# Patient Record
Sex: Female | Born: 1945 | Race: White | Hispanic: No | Marital: Married | Smoking: Former smoker
Health system: Southern US, Community
[De-identification: ages and names within clinical notes are randomized; demographics above are authoritative.]

## PROBLEM LIST (undated history)

## (undated) DIAGNOSIS — I10 Essential (primary) hypertension: Secondary | ICD-10-CM

## (undated) DIAGNOSIS — J449 Chronic obstructive pulmonary disease, unspecified: Secondary | ICD-10-CM

---

## 2003-08-31 DIAGNOSIS — M19049 Primary osteoarthritis, unspecified hand: Secondary | ICD-10-CM | POA: Insufficient documentation

## 2009-11-19 DIAGNOSIS — R918 Other nonspecific abnormal finding of lung field: Secondary | ICD-10-CM | POA: Insufficient documentation

## 2010-02-04 DIAGNOSIS — M949 Disorder of cartilage, unspecified: Secondary | ICD-10-CM | POA: Insufficient documentation

## 2010-02-04 DIAGNOSIS — M899 Disorder of bone, unspecified: Secondary | ICD-10-CM | POA: Insufficient documentation

## 2011-12-08 DIAGNOSIS — J309 Allergic rhinitis, unspecified: Secondary | ICD-10-CM | POA: Insufficient documentation

## 2012-07-20 DIAGNOSIS — I2510 Atherosclerotic heart disease of native coronary artery without angina pectoris: Secondary | ICD-10-CM | POA: Insufficient documentation

## 2012-10-24 ENCOUNTER — Inpatient Hospital Stay (HOSPITAL_COMMUNITY)
Admission: EM | Admit: 2012-10-24 | Discharge: 2012-10-28 | DRG: 184 | Disposition: A | Discharge status: Home / Self Care | Payer: Medicare Other | Attending: General Surgery | Admitting: General Surgery

## 2012-10-24 ENCOUNTER — Emergency Department (HOSPITAL_COMMUNITY): Payer: Medicare Other

## 2012-10-24 ENCOUNTER — Encounter (HOSPITAL_COMMUNITY): Payer: Self-pay | Admitting: Emergency Medicine

## 2012-10-24 DIAGNOSIS — Z87891 Personal history of nicotine dependence: Secondary | ICD-10-CM

## 2012-10-24 DIAGNOSIS — E041 Nontoxic single thyroid nodule: Secondary | ICD-10-CM | POA: Diagnosis present

## 2012-10-24 DIAGNOSIS — S42409A Unspecified fracture of lower end of unspecified humerus, initial encounter for closed fracture: Secondary | ICD-10-CM | POA: Diagnosis present

## 2012-10-24 DIAGNOSIS — IMO0002 Reserved for concepts with insufficient information to code with codable children: Secondary | ICD-10-CM | POA: Diagnosis present

## 2012-10-24 DIAGNOSIS — S2220XA Unspecified fracture of sternum, initial encounter for closed fracture: Secondary | ICD-10-CM

## 2012-10-24 DIAGNOSIS — Z9981 Dependence on supplemental oxygen: Secondary | ICD-10-CM

## 2012-10-24 DIAGNOSIS — I2510 Atherosclerotic heart disease of native coronary artery without angina pectoris: Secondary | ICD-10-CM | POA: Diagnosis present

## 2012-10-24 DIAGNOSIS — I10 Essential (primary) hypertension: Secondary | ICD-10-CM | POA: Diagnosis present

## 2012-10-24 DIAGNOSIS — J449 Chronic obstructive pulmonary disease, unspecified: Secondary | ICD-10-CM | POA: Diagnosis present

## 2012-10-24 DIAGNOSIS — S5010XA Contusion of unspecified forearm, initial encounter: Secondary | ICD-10-CM | POA: Diagnosis present

## 2012-10-24 DIAGNOSIS — M25429 Effusion, unspecified elbow: Secondary | ICD-10-CM | POA: Diagnosis present

## 2012-10-24 DIAGNOSIS — S82899A Other fracture of unspecified lower leg, initial encounter for closed fracture: Secondary | ICD-10-CM

## 2012-10-24 DIAGNOSIS — S42401A Unspecified fracture of lower end of right humerus, initial encounter for closed fracture: Secondary | ICD-10-CM

## 2012-10-24 DIAGNOSIS — M19049 Primary osteoarthritis, unspecified hand: Secondary | ICD-10-CM | POA: Diagnosis present

## 2012-10-24 DIAGNOSIS — T07XXXA Unspecified multiple injuries, initial encounter: Secondary | ICD-10-CM | POA: Diagnosis present

## 2012-10-24 DIAGNOSIS — S82891A Other fracture of right lower leg, initial encounter for closed fracture: Secondary | ICD-10-CM

## 2012-10-24 DIAGNOSIS — J4489 Other specified chronic obstructive pulmonary disease: Secondary | ICD-10-CM | POA: Diagnosis present

## 2012-10-24 DIAGNOSIS — S52043A Displaced fracture of coronoid process of unspecified ulna, initial encounter for closed fracture: Secondary | ICD-10-CM | POA: Diagnosis present

## 2012-10-24 HISTORY — DX: Essential (primary) hypertension: I10

## 2012-10-24 HISTORY — DX: Chronic obstructive pulmonary disease, unspecified: J44.9

## 2012-10-24 LAB — COMPREHENSIVE METABOLIC PANEL
ALT: 23 U/L (ref 0–35)
BUN: 14 mg/dL (ref 6–23)
Calcium: 9 mg/dL (ref 8.4–10.5)
Creatinine, Ser: 0.58 mg/dL (ref 0.50–1.10)
GFR calc Af Amer: >90 mL/min (ref >90)
Glucose, Bld: 101 mg/dL — ABNORMAL HIGH (ref 70–99)
Sodium: 142 mEq/L (ref 135–145)
Total Protein: 6.6 g/dL (ref 6.0–8.3)

## 2012-10-24 LAB — CBC WITH DIFFERENTIAL/PLATELET
Basophils Absolute: 0 10*3/uL (ref 0.0–0.1)
Eosinophils Absolute: 0 10*3/uL (ref 0.0–0.7)
Eosinophils Relative: 0 % (ref 0–5)
Hemoglobin: 12.5 g/dL (ref 12.0–15.0)
Lymphs Abs: 1.3 10*3/uL (ref 0.7–4.0)
MCH: 30.9 pg (ref 26.0–34.0)
MCV: 93.3 fL (ref 78.0–100.0)
Monocytes Relative: 6 % (ref 3–12)
Platelets: 282 10*3/uL (ref 150–400)
RBC: 4.04 MIL/uL (ref 3.87–5.11)
RDW: 13.3 % (ref 11.5–15.5)

## 2012-10-24 LAB — PROTIME-INR
INR: 0.93 (ref 0.00–1.49)
Prothrombin Time: 12.3 seconds (ref 11.6–15.2)

## 2012-10-24 LAB — TYPE AND SCREEN
ABO/RH(D): B NEG
Antibody Screen: NEGATIVE

## 2012-10-24 LAB — ABO/RH: ABO/RH(D): B NEG

## 2012-10-24 MED ORDER — HYDROMORPHONE HCL PF 1 MG/ML IJ SOLN
0.5000 mg | Freq: Once | INTRAMUSCULAR | Status: AC
  Administered 2012-10-24: 0.5 mg via INTRAVENOUS
  Filled 2012-10-24: qty 1

## 2012-10-24 MED ORDER — SODIUM CHLORIDE 0.9 % IV BOLUS (SEPSIS)
1000.0000 mL | Freq: Once | INTRAVENOUS | Status: AC
  Administered 2012-10-24: 1000 mL via INTRAVENOUS

## 2012-10-24 MED ORDER — IOHEXOL 300 MG/ML  SOLN
75.0000 mL | Freq: Once | INTRAMUSCULAR | Status: AC | PRN
  Administered 2012-10-24: 75 mL via INTRAVENOUS

## 2012-10-24 MED ORDER — OXYCODONE-ACETAMINOPHEN 5-325 MG PO TABS
1.0000 | ORAL_TABLET | Freq: Once | ORAL | Status: AC
  Administered 2012-10-24: 1 via ORAL
  Filled 2012-10-24: qty 1

## 2012-10-24 NOTE — ED Notes (Signed)
Pt to bathroom via wheelchair.
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
## 2012-10-24 NOTE — ED Notes (Signed)
Dr. Andrey Campanile in to assess pt and talk to family ref. Admission to hosp.
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
## 2012-10-24 NOTE — ED Notes (Signed)
Received report and introduced self to the pt.  No new needs at this time but will continue to monitor the pt

## 2012-10-25 ENCOUNTER — Encounter (HOSPITAL_COMMUNITY): Payer: Self-pay | Admitting: *Deleted

## 2012-10-25 ENCOUNTER — Observation Stay (HOSPITAL_COMMUNITY): Payer: Medicare Other

## 2012-10-25 DIAGNOSIS — T07XXXA Unspecified multiple injuries, initial encounter: Secondary | ICD-10-CM | POA: Diagnosis present

## 2012-10-25 DIAGNOSIS — S42401A Unspecified fracture of lower end of right humerus, initial encounter for closed fracture: Secondary | ICD-10-CM

## 2012-10-25 DIAGNOSIS — S82891A Other fracture of right lower leg, initial encounter for closed fracture: Secondary | ICD-10-CM

## 2012-10-25 DIAGNOSIS — S2220XA Unspecified fracture of sternum, initial encounter for closed fracture: Secondary | ICD-10-CM

## 2012-10-25 LAB — CBC
Hemoglobin: 11.4 g/dL — ABNORMAL LOW (ref 12.0–15.0)
MCV: 96.4 fL (ref 78.0–100.0)
Platelets: 262 10*3/uL (ref 150–400)
RBC: 3.63 MIL/uL — ABNORMAL LOW (ref 3.87–5.11)
RDW: 13.8 % (ref 11.5–15.5)
WBC: 7.4 10*3/uL (ref 4.0–10.5)

## 2012-10-25 LAB — BASIC METABOLIC PANEL
CO2: 28 mEq/L (ref 19–32)
Chloride: 104 mEq/L (ref 96–112)
Creatinine, Ser: 0.6 mg/dL (ref 0.50–1.10)
GFR calc Af Amer: >90 mL/min (ref >90)
Glucose, Bld: 98 mg/dL (ref 70–99)
Potassium: 3.8 mEq/L (ref 3.5–5.1)
Sodium: 140 mEq/L (ref 135–145)

## 2012-10-25 MED ORDER — PANTOPRAZOLE SODIUM 40 MG IV SOLR
40.0000 mg | Freq: Every day | INTRAVENOUS | Status: DC
  Filled 2012-10-25 (×2): qty 40

## 2012-10-25 MED ORDER — OXYCODONE HCL 5 MG PO TABS
5.0000 mg | ORAL_TABLET | ORAL | Status: DC | PRN
  Administered 2012-10-25: 5 mg via ORAL
  Filled 2012-10-25: qty 1

## 2012-10-25 MED ORDER — LISINOPRIL 20 MG PO TABS
20.0000 mg | ORAL_TABLET | Freq: Every day | ORAL | Status: DC
  Administered 2012-10-25: 10:00:00 20 mg via ORAL
  Filled 2012-10-25 (×4): qty 1

## 2012-10-25 MED ORDER — OXYCODONE HCL 5 MG PO TABS: 2.5000 mg | ORAL_TABLET | ORAL | Status: DC | PRN

## 2012-10-25 MED ORDER — METOPROLOL TARTRATE 25 MG PO TABS
25.0000 mg | ORAL_TABLET | Freq: Two times a day (BID) | ORAL | Status: DC
Start: 2012-10-25 — End: 2012-10-28
  Administered 2012-10-25 (×2): 25 mg via ORAL
  Filled 2012-10-25 (×10): qty 1

## 2012-10-25 MED ORDER — ENOXAPARIN SODIUM 40 MG/0.4ML ~~LOC~~ SOLN
40.0000 mg | SUBCUTANEOUS | Status: DC
  Administered 2012-10-25 – 2012-10-28 (×4): 40 mg via SUBCUTANEOUS
  Filled 2012-10-25 (×5): qty 0.4

## 2012-10-25 MED ORDER — DOCUSATE SODIUM 100 MG PO CAPS
100.0000 mg | ORAL_CAPSULE | Freq: Two times a day (BID) | ORAL | Status: DC
  Administered 2012-10-25 – 2012-10-27 (×5): 100 mg via ORAL
  Filled 2012-10-25 (×5): qty 1

## 2012-10-25 MED ORDER — MOMETASONE FURO-FORMOTEROL FUM 200-5 MCG/ACT IN AERO
2.0000 | INHALATION_SPRAY | Freq: Two times a day (BID) | RESPIRATORY_TRACT | Status: DC
  Administered 2012-10-25 – 2012-10-28 (×6): 2 via RESPIRATORY_TRACT
  Filled 2012-10-25 (×2): qty 8.8

## 2012-10-25 MED ORDER — METHOCARBAMOL 500 MG PO TABS
500.0000 mg | ORAL_TABLET | Freq: Four times a day (QID) | ORAL | Status: DC | PRN
  Administered 2012-10-26 – 2012-10-27 (×2): 500 mg via ORAL
  Filled 2012-10-25 (×3): qty 1

## 2012-10-25 MED ORDER — MORPHINE SULFATE 2 MG/ML IJ SOLN: 1.0000 mg | INTRAMUSCULAR | Status: DC | PRN

## 2012-10-25 MED ORDER — TRAMADOL HCL 50 MG PO TABS
50.0000 mg | ORAL_TABLET | Freq: Four times a day (QID) | ORAL | Status: DC
  Administered 2012-10-25 – 2012-10-28 (×12): 50 mg via ORAL
  Filled 2012-10-25 (×12): qty 1

## 2012-10-25 MED ORDER — HYDROCODONE-ACETAMINOPHEN 10-325 MG PO TABS
0.5000 | ORAL_TABLET | ORAL | Status: DC | PRN
  Administered 2012-10-25: 1 via ORAL
  Administered 2012-10-25: 2 via ORAL
  Administered 2012-10-26 – 2012-10-28 (×4): 1 via ORAL
  Filled 2012-10-25 (×5): qty 1
  Filled 2012-10-25: qty 2

## 2012-10-25 MED ORDER — POTASSIUM CHLORIDE IN NACL 20-0.9 MEQ/L-% IV SOLN
INTRAVENOUS | Status: DC
  Administered 2012-10-25: 02:00:00 via INTRAVENOUS
  Filled 2012-10-25: qty 1000

## 2012-10-25 MED ORDER — ONDANSETRON HCL 4 MG/2ML IJ SOLN
4.0000 mg | Freq: Four times a day (QID) | INTRAMUSCULAR | Status: DC | PRN
  Administered 2012-10-26: 4 mg via INTRAVENOUS
  Filled 2012-10-25: qty 2

## 2012-10-25 MED ORDER — MORPHINE SULFATE 2 MG/ML IJ SOLN: 2.0000 mg | INTRAMUSCULAR | Status: DC | PRN

## 2012-10-25 MED ORDER — OXYCODONE HCL 5 MG PO TABS
10.0000 mg | ORAL_TABLET | ORAL | Status: DC | PRN
  Administered 2012-10-25: 10 mg via ORAL
  Filled 2012-10-25: qty 2

## 2012-10-25 MED ORDER — PANTOPRAZOLE SODIUM 40 MG PO TBEC
40.0000 mg | DELAYED_RELEASE_TABLET | Freq: Every day | ORAL | Status: DC
  Administered 2012-10-25 – 2012-10-27 (×3): 40 mg via ORAL
  Filled 2012-10-25 (×3): qty 1

## 2012-10-25 MED ORDER — TIOTROPIUM BROMIDE MONOHYDRATE 18 MCG IN CAPS
18.0000 ug | ORAL_CAPSULE | Freq: Every day | RESPIRATORY_TRACT | Status: DC
  Administered 2012-10-25 – 2012-10-28 (×4): 18 ug via RESPIRATORY_TRACT
  Filled 2012-10-25: qty 5

## 2012-10-25 MED ORDER — ONDANSETRON HCL 4 MG PO TABS
4.0000 mg | ORAL_TABLET | Freq: Four times a day (QID) | ORAL | Status: DC | PRN
  Administered 2012-10-27 – 2012-10-28 (×3): 4 mg via ORAL
  Filled 2012-10-25 (×3): qty 1

## 2012-10-25 NOTE — Progress Notes (Signed)
Orthopedic Tech Progress Note Patient Details:  Lynn Mays [birth year] [id number] Right Air cast splint ordered for OOB use. Proper application and demonstration shown to patient and air cast placed on patient's nightstand. Arm sling applied to right arm.  Ortho Devices Type of Ortho Device: Ankle Air splint;Arm sling Ortho Device/Splint Location: Right Ortho Device/Splint Interventions: Application   Asia R Thompson 10/25/2012, 1:39 PM

## 2012-10-25 NOTE — Progress Notes (Signed)
Pt lying in bed O4x  pain 10 in arm. PRn given. Will continue to monitor
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
## 2012-10-25 NOTE — Progress Notes (Signed)
OT Cancellation Note  Patient Details Name: Lynn Mays MRN: [id number] DOB: 15-Jun-1945   Cancelled Treatment:    Reason Eval/Treat Not Completed: Fatigue/lethargy limiting ability to participate Fatigued after PT session. Will see in am [facility], OTR/L  [phone] 10/25/2012 10/25/2012, 5:14 PM

## 2012-10-25 NOTE — Progress Notes (Signed)
Orthopedic Tech Progress Note Patient Details:  Lynn Mays 1945/08/02 [id number]  Ortho Devices Type of Ortho Device: Ace wrap;Post (long arm) splint Ortho Device/Splint Location: RUE Ortho Device/Splint Interventions: Ordered;Application   [name] 10/25/2012, 7:04 PM
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
## 2012-10-26 NOTE — Progress Notes (Signed)
MD on call notified that pt had a bp 119/38 by dymap  Rechecked bp 120/40 manual will continue to monitor. Ilean Skill LPN
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
## 2012-10-26 NOTE — Progress Notes (Signed)
Patient a bit nauseated currently.  Talked with a physician in the [facility] in [place], [place] abotu arranging for patient to have some orthopedic follow up.  The information was taken and they will proceed accordingly.  Probably will not go home until tomorrow.  This patient has been seen and I agree with the findings and treatment plan.  Marta Lamas. Gae Bon, MD, FACS [phone] (pager) [phone] (direct pager) Trauma Surgeon

## 2012-10-27 DIAGNOSIS — T07XXXA Unspecified multiple injuries, initial encounter: Secondary | ICD-10-CM | POA: Diagnosis present

## 2012-10-27 DIAGNOSIS — M19049 Primary osteoarthritis, unspecified hand: Secondary | ICD-10-CM | POA: Diagnosis present

## 2012-10-27 DIAGNOSIS — S52043A Displaced fracture of coronoid process of unspecified ulna, initial encounter for closed fracture: Secondary | ICD-10-CM | POA: Diagnosis present

## 2012-10-27 DIAGNOSIS — S2220XA Unspecified fracture of sternum, initial encounter for closed fracture: Secondary | ICD-10-CM | POA: Diagnosis present

## 2012-10-27 DIAGNOSIS — M25429 Effusion, unspecified elbow: Secondary | ICD-10-CM | POA: Diagnosis present

## 2012-10-27 DIAGNOSIS — S42409A Unspecified fracture of lower end of unspecified humerus, initial encounter for closed fracture: Secondary | ICD-10-CM | POA: Diagnosis present

## 2012-10-27 DIAGNOSIS — E041 Nontoxic single thyroid nodule: Secondary | ICD-10-CM | POA: Diagnosis present

## 2012-10-27 DIAGNOSIS — S5010XA Contusion of unspecified forearm, initial encounter: Secondary | ICD-10-CM | POA: Diagnosis present

## 2012-10-27 DIAGNOSIS — IMO0002 Reserved for concepts with insufficient information to code with codable children: Secondary | ICD-10-CM | POA: Diagnosis present

## 2012-10-27 DIAGNOSIS — S82899A Other fracture of unspecified lower leg, initial encounter for closed fracture: Secondary | ICD-10-CM | POA: Diagnosis present

## 2012-10-27 MED ORDER — HYDROCODONE-ACETAMINOPHEN 5-325 MG PO TABS: 1.0000 | ORAL_TABLET | ORAL | Status: AC | PRN

## 2012-10-27 MED ORDER — METHOCARBAMOL 500 MG PO TABS: 500.0000 mg | ORAL_TABLET | Freq: Four times a day (QID) | ORAL | Status: AC | PRN

## 2012-10-27 MED ORDER — TRAMADOL HCL 50 MG PO TABS: 50.0000 mg | ORAL_TABLET | Freq: Four times a day (QID) | ORAL | Status: AC

## 2012-10-27 NOTE — Progress Notes (Signed)
Not able to go home today.  Likely to be discharged tomorrow.  Marta Lamas. Gae Bon, MD, FACS [phone] Trauma Surgeon
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
## 2012-10-27 NOTE — Progress Notes (Signed)
Orthopedic Tech Progress Note Patient Details:  Lynn Mays [birth year] [id number] Called in order for Rt. hinged elbow brace to [employer]. Patient ID: Lynn Mays, female   DOB: November 18, 1945, 67 y.o.   MRN: [id number]   [name] 10/27/2012, 9:56 AM
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
## 2012-10-27 NOTE — Progress Notes (Signed)
Home tomorrow.  Doing fine.  This patient has been seen and I agree with the findings and treatment plan.  Marta Lamas. Gae Bon, MD, FACS [phone] (pager) [phone] (direct pager) Trauma Surgeon

## 2012-10-27 NOTE — Progress Notes (Signed)
[facility] Follow up  Discussed case with [employer] colleague. Agrees with non-op tx Will have pt placed into hinged elbow brace.   Full flexion to 45 degrees of extension  Ok for pt to dc this afternoon.    Will need follow up in [place]  There are several ortho trauma surgeons at the [facility] in [place].  Recommend follow up there in 7-10 days Will have our office facilitate follow up   [facility] [address] [place], [place] [address]  Office Phone: [phone]     Will see pt later this morning   Mearl Latin, PA-C [facility] [phone] (P) 10/27/2012 8:41 AM

## 2012-10-27 NOTE — Progress Notes (Signed)
[facility] Progress Note Patient Details:  Lynn Mays 1945/09/11 [id number] Cancelled order from [facility].  Ordered hinged elbow brace from [facility]. Patient ID: Lynn Mays, female   DOB: 10/02/1945, 67 y.o.   MRN: [id number]   [name] 10/27/2012, 11:17 AM
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
## 2012-10-28 NOTE — Progress Notes (Signed)
DC instructions reviewed with patient and husband, questions answered, verbalized understanding.  Rx given for pain medications.  Patient transported via wheelchair to front of hospital to be taken home by husband.
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
## 2012-11-14 NOTE — Progress Notes (Signed)
I saw and examined the patient with Mr. Paul, communicating the findings and plan noted above.  Sosie Gato, MD [facility] [phone] (p)  

## 2012-11-14 NOTE — Progress Notes (Signed)
See other full dictation  Myrene Galas, MD [facility] [phone] (p)
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
## 2018-03-06 DEATH — deceased
